# Patient Record
Sex: Male | Born: 1990 | Race: White | Hispanic: No | Marital: Single | Smoking: Never smoker
Health system: Southern US, Community
[De-identification: ages and names within clinical notes are randomized; demographics above are authoritative.]

## PROBLEM LIST (undated history)

## (undated) DIAGNOSIS — K219 Gastro-esophageal reflux disease without esophagitis: Secondary | ICD-10-CM

---
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
## 2012-08-21 ENCOUNTER — Emergency Department (HOSPITAL_COMMUNITY)
Admission: EM | Admit: 2012-08-21 | Discharge: 2012-08-21 | Disposition: A | Discharge status: Home / Self Care | Payer: Self-pay | Attending: Emergency Medicine | Admitting: Emergency Medicine

## 2012-08-21 ENCOUNTER — Emergency Department (HOSPITAL_COMMUNITY): Payer: Self-pay

## 2012-08-21 ENCOUNTER — Encounter (HOSPITAL_COMMUNITY): Payer: Self-pay | Admitting: *Deleted

## 2012-08-21 DIAGNOSIS — M25539 Pain in unspecified wrist: Secondary | ICD-10-CM | POA: Insufficient documentation

## 2012-08-21 DIAGNOSIS — K219 Gastro-esophageal reflux disease without esophagitis: Secondary | ICD-10-CM | POA: Insufficient documentation

## 2012-08-21 DIAGNOSIS — S61519A Laceration without foreign body of unspecified wrist, initial encounter: Secondary | ICD-10-CM

## 2012-08-21 DIAGNOSIS — S61509A Unspecified open wound of unspecified wrist, initial encounter: Secondary | ICD-10-CM | POA: Insufficient documentation

## 2012-08-21 DIAGNOSIS — W268XXA Contact with other sharp object(s), not elsewhere classified, initial encounter: Secondary | ICD-10-CM | POA: Insufficient documentation

## 2012-08-21 HISTORY — DX: Gastro-esophageal reflux disease without esophagitis: K21.9

## 2012-08-21 MED ORDER — TETANUS-DIPHTH-ACELL PERTUSSIS 5-2.5-18.5 LF-MCG/0.5 IM SUSP
0.5000 mL | Freq: Once | INTRAMUSCULAR | Status: AC
  Administered 2012-08-21: 0.5 mL via INTRAMUSCULAR
  Filled 2012-08-21: qty 0.5
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
## 2012-08-21 NOTE — ED Notes (Signed)
Patient transported to X-ray 

## 2012-08-21 NOTE — ED Provider Notes (Signed)
Medical screening examination/treatment/procedure(s) were performed by non-physician practitioner and as supervising physician I was immediately available for consultation/collaboration.   Gavin Pound. Annslee Tercero, MD 08/21/12 1402
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
## 2014-08-20 ENCOUNTER — Emergency Department (HOSPITAL_COMMUNITY): Payer: BC Managed Care – PPO

## 2014-08-20 ENCOUNTER — Emergency Department (HOSPITAL_COMMUNITY)
Admission: EM | Admit: 2014-08-20 | Discharge: 2014-08-20 | Disposition: A | Discharge status: Home / Self Care | Payer: BC Managed Care – PPO | Attending: Emergency Medicine | Admitting: Emergency Medicine

## 2014-08-20 ENCOUNTER — Encounter (HOSPITAL_COMMUNITY): Payer: Self-pay | Admitting: Emergency Medicine

## 2014-08-20 DIAGNOSIS — Z8719 Personal history of other diseases of the digestive system: Secondary | ICD-10-CM | POA: Diagnosis not present

## 2014-08-20 DIAGNOSIS — Y9389 Activity, other specified: Secondary | ICD-10-CM | POA: Diagnosis not present

## 2014-08-20 DIAGNOSIS — Y9289 Other specified places as the place of occurrence of the external cause: Secondary | ICD-10-CM | POA: Diagnosis not present

## 2014-08-20 DIAGNOSIS — S0101XA Laceration without foreign body of scalp, initial encounter: Secondary | ICD-10-CM | POA: Diagnosis not present

## 2014-08-20 DIAGNOSIS — S0990XA Unspecified injury of head, initial encounter: Secondary | ICD-10-CM | POA: Diagnosis present

## 2014-08-20 NOTE — Discharge Instructions (Signed)
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
## 2014-08-20 NOTE — ED Provider Notes (Signed)
I saw and evaluated the patient, reviewed the resident's note and I agree with the findings and plan.   .Face to face Exam:  General:  Awake HEENT:  Scalp laceration Resp:  Normal effort Abd:  Nondistended Neuro:No focal weakness    Nelia Shiobert L Jarrel Knoke, MD 08/20/14 1816
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
## 2019-06-08 ENCOUNTER — Other Ambulatory Visit: Payer: Self-pay

## 2019-06-08 ENCOUNTER — Ambulatory Visit (HOSPITAL_COMMUNITY)
Admission: EM | Admit: 2019-06-08 | Discharge: 2019-06-08 | Disposition: A | Discharge status: Home / Self Care | Payer: Self-pay | Attending: Family Medicine | Admitting: Family Medicine

## 2019-06-08 ENCOUNTER — Ambulatory Visit (INDEPENDENT_AMBULATORY_CARE_PROVIDER_SITE_OTHER): Payer: Self-pay

## 2019-06-08 ENCOUNTER — Encounter (HOSPITAL_COMMUNITY): Payer: Self-pay | Admitting: Emergency Medicine

## 2019-06-08 DIAGNOSIS — R531 Weakness: Secondary | ICD-10-CM | POA: Insufficient documentation

## 2019-06-08 DIAGNOSIS — Z79899 Other long term (current) drug therapy: Secondary | ICD-10-CM | POA: Insufficient documentation

## 2019-06-08 DIAGNOSIS — Z20828 Contact with and (suspected) exposure to other viral communicable diseases: Secondary | ICD-10-CM | POA: Insufficient documentation

## 2019-06-08 DIAGNOSIS — R05 Cough: Secondary | ICD-10-CM | POA: Insufficient documentation

## 2019-06-08 DIAGNOSIS — K219 Gastro-esophageal reflux disease without esophagitis: Secondary | ICD-10-CM | POA: Insufficient documentation

## 2019-06-08 DIAGNOSIS — R1110 Vomiting, unspecified: Secondary | ICD-10-CM | POA: Insufficient documentation

## 2019-06-08 DIAGNOSIS — R059 Cough, unspecified: Secondary | ICD-10-CM

## 2019-06-08 MED ORDER — ONDANSETRON 4 MG PO TBDP: 4.0000 mg | ORAL_TABLET | Freq: Three times a day (TID) | ORAL | 0 refills | Status: AC | PRN

## 2019-06-08 NOTE — Discharge Instructions (Addendum)
Your x-ray did not show any bowel obstruction and lungs were clear. Keep taking the omeprazole 20 mg daily. Avoid spicy, greasy foods and milk products. I will give you some Zofran to use as needed for nausea, vomiting We are sending your swab for COVID testing and we will call you if the results are positive Make sure you are taking precautions until we get your results. If your symptoms worsen you will need to go the ER.  Make sure you are staying hydrated and drinking plenty of water and Gatorade to avoid dehydration. Work note given to return on Friday if COVID results are negative.

## 2019-06-08 NOTE — ED Triage Notes (Signed)
Pt states since Friday hes felt fatigued, weak, states he has no appetite, has been vomiting. No pain

## 2019-06-08 NOTE — ED Provider Notes (Signed)
[facility]    CSN: [id number] Arrival date & time: 06/08/19  [id number]     History   Chief Complaint Chief Complaint  Patient presents with  . Weakness  . Emesis    HPI Timothy Blake is a 28 y.o. male.   Patient is an otherwise healthy 28 year old male that has been feeling fatigued, weak, loss of appetite and vomiting since 4 days ago.  Vomiting mostly after eating or drinking.  Symptoms have been waxing waning.  Reporting last bowel movement was Saturday.  No diarrhea.  He has also been coughing up mucus with black specks.  Admits to smoking marijuana.  Coughing with mucus production only in the morning.  No chest pain or shortness of breath.  Reporting fever of 102 yesterday.  Mom recently sick but tested negative for COVID.  Otherwise no recent sick contacts or recent traveling.   ROS per HPI      Past Medical History:  Diagnosis Date  . GERD (gastroesophageal reflux disease)     There are no active problems to display for this patient.   History reviewed. No pertinent surgical history.     Home Medications    Prior to Admission medications   Medication Sig Start Date End Date Taking? Authorizing Provider  omeprazole (PRILOSEC) 20 MG capsule Take 20 mg by mouth daily.    [provider]  ondansetron (ZOFRAN ODT) 4 MG disintegrating tablet Take 1 tablet (4 mg total) by mouth every 8 (eight) hours as needed for nausea or vomiting. 06/08/19   Janace ArisBast, Daxson Reffett A, NP    Family History No family history on file.  Social History Social History   Tobacco Use  . Smoking status: Not on file  Substance Use Topics  . Alcohol use: No  . Drug use: No     Allergies   Patient has no known allergies.   Review of Systems Review of Systems   Physical Exam Triage Vital Signs ED Triage Vitals  Enc Vitals Group     BP 06/08/19 1026 119/63     Pulse Rate 06/08/19 1026 91     Resp 06/08/19 1026 18     Temp 06/08/19 1027 98.1 F (36.7 C)   Temp src --      SpO2 06/08/19 1026 100 %     Weight --      Height --      Head Circumference --      Peak Flow --      Pain Score 06/08/19 1026 0     Pain Loc --      Pain Edu? --      Excl. in [facility]? --    No data found.  Updated Vital Signs BP 119/63   Pulse 91   Temp 98.1 F (36.7 C)   Resp 18   SpO2 100%   Visual Acuity Right Eye Distance:   Left Eye Distance:   Bilateral Distance:    Right Eye Near:   Left Eye Near:    Bilateral Near:     Physical Exam Vitals signs and nursing note reviewed.  Constitutional:      General: He is not in acute distress.    Appearance: Normal appearance. He is well-developed. He is not ill-appearing, toxic-appearing or diaphoretic.  HENT:     Head: Normocephalic and atraumatic.  Eyes:     Conjunctiva/sclera: Conjunctivae normal.  Neck:     Musculoskeletal: Neck supple.  Cardiovascular:     Rate and  Rhythm: Normal rate and regular rhythm.     Heart sounds: No murmur.  Pulmonary:     Effort: Pulmonary effort is normal. No respiratory distress.     Breath sounds: Normal breath sounds.     Comments: Course lung sounds in lower lobes.  Abdominal:     Palpations: Abdomen is soft.     Tenderness: There is no abdominal tenderness.  Musculoskeletal: Normal range of motion.  Skin:    General: Skin is warm and dry.  Neurological:     Mental Status: He is alert.  Psychiatric:        Mood and Affect: Mood normal.      UC Treatments / Results  Labs (all labs ordered are listed, but only abnormal results are displayed) Labs Reviewed  NOVEL CORONAVIRUS, NAA (HOSPITAL ORDER, SEND-OUT TO REF LAB)    EKG   Radiology Dg Abd Acute W/chest  Result Date: 06/08/2019 CLINICAL DATA:  Cough with nausea vomiting.  Abdominal pain EXAM: DG ABDOMEN ACUTE W/ 1V CHEST COMPARISON:  None. FINDINGS: There is no evidence of dilated bowel loops or free intraperitoneal air. No radiopaque calculi or other significant radiographic abnormality is  seen. Heart size and mediastinal contours are within normal limits. Both lungs are clear. IMPRESSION: Negative abdominal radiographs.  No acute cardiopulmonary disease. Electronically Signed   By: Franchot Gallo M.D.   On: 06/08/2019 11:28    Procedures Procedures (including critical care time)  Medications Ordered in UC Medications - No data to display  Initial Impression / Assessment and Plan / UC Course  I have reviewed the triage vital signs and the nursing notes.  Pertinent labs & imaging results that were available during my care of the patient were reviewed by me and considered in my medical decision making (see chart for details).     Patient is a 28 year old male with weakness, fatigue, loss of appetite and vomiting. Symptoms have been waxing waning. No acute abdomen on exam.  Vital signs are stable and he is nontoxic or ill-appearing.  X-ray of abdomen and chest revealed no acute abnormalities. No bowel obstruction, severity constipation or lower respiratory tract infection.  Sending COVID swab for testing. Instructed to rest, hydrate and monitor symptoms.  If symptoms severely worsen he will need to go the ER.  Otherwise if COVID results are negative he can return to work on Friday if feeling better Follow up as needed for continued or worsening symptoms  Final Clinical Impressions(s) / UC Diagnoses   Final diagnoses:  Vomiting, intractability of vomiting not specified, presence of nausea not specified, unspecified vomiting type  Cough     Discharge Instructions     Your x-ray did not show any bowel obstruction and lungs were clear. Keep taking the omeprazole 20 mg daily. Avoid spicy, greasy foods and milk products. I will give you some Zofran to use as needed for nausea, vomiting We are sending your swab for COVID testing and we will call you if the results are positive Make sure you are taking precautions until we get your results. If your symptoms worsen you  will need to go the ER.  Make sure you are staying hydrated and drinking plenty of water and Gatorade to avoid dehydration. Work note given to return on Friday if COVID results are negative.    ED Prescriptions    Medication Sig Dispense Auth. Provider   ondansetron (ZOFRAN ODT) 4 MG disintegrating tablet Take 1 tablet (4 mg total) by mouth every 8 (eight)  hours as needed for nausea or vomiting. 20 tablet Dahlia ByesBast, Aimi Essner A, NP     Controlled Substance Prescriptions [place] Controlled Substance Registry consulted? Not Applicable   Janace ArisBast, Hisham Provence A, NP 06/08/19 1141

## 2019-06-10 LAB — NOVEL CORONAVIRUS, NAA (HOSP ORDER, SEND-OUT TO REF LAB; TAT 18-24 HRS): SARS-CoV-2, NAA: NOT DETECTED

## 2021-08-24 ENCOUNTER — Emergency Department (HOSPITAL_COMMUNITY)
Admission: EM | Admit: 2021-08-24 | Discharge: 2021-08-25 | Disposition: A | Discharge status: Home / Self Care | Payer: Self-pay | Attending: Emergency Medicine | Admitting: Emergency Medicine

## 2021-08-24 ENCOUNTER — Other Ambulatory Visit: Payer: Self-pay

## 2021-08-24 ENCOUNTER — Emergency Department (HOSPITAL_COMMUNITY): Payer: Self-pay

## 2021-08-24 ENCOUNTER — Encounter (HOSPITAL_COMMUNITY): Payer: Self-pay | Admitting: *Deleted

## 2021-08-24 DIAGNOSIS — S6990XA Unspecified injury of unspecified wrist, hand and finger(s), initial encounter: Secondary | ICD-10-CM

## 2021-08-24 DIAGNOSIS — Z5321 Procedure and treatment not carried out due to patient leaving prior to being seen by health care provider: Secondary | ICD-10-CM | POA: Insufficient documentation

## 2021-08-24 DIAGNOSIS — S6991XA Unspecified injury of right wrist, hand and finger(s), initial encounter: Secondary | ICD-10-CM | POA: Insufficient documentation

## 2021-08-24 DIAGNOSIS — W228XXA Striking against or struck by other objects, initial encounter: Secondary | ICD-10-CM | POA: Insufficient documentation
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
## 2021-08-25 NOTE — ED Notes (Signed)
Pt decide to leave despite medical advice

## 2022-03-30 ENCOUNTER — Other Ambulatory Visit: Payer: Self-pay

## 2022-03-30 ENCOUNTER — Emergency Department (HOSPITAL_COMMUNITY): Payer: Self-pay

## 2022-03-30 ENCOUNTER — Encounter (HOSPITAL_COMMUNITY): Payer: Self-pay | Admitting: Emergency Medicine

## 2022-03-30 ENCOUNTER — Emergency Department (HOSPITAL_COMMUNITY)
Admission: EM | Admit: 2022-03-30 | Discharge: 2022-03-30 | Disposition: A | Discharge status: Home / Self Care | Payer: Self-pay | Attending: Emergency Medicine | Admitting: Emergency Medicine

## 2022-03-30 DIAGNOSIS — S51831A Puncture wound without foreign body of right forearm, initial encounter: Secondary | ICD-10-CM | POA: Insufficient documentation

## 2022-03-30 DIAGNOSIS — S21139A Puncture wound without foreign body of unspecified front wall of thorax without penetration into thoracic cavity, initial encounter: Secondary | ICD-10-CM | POA: Insufficient documentation

## 2022-03-30 DIAGNOSIS — Z23 Encounter for immunization: Secondary | ICD-10-CM | POA: Insufficient documentation

## 2022-03-30 DIAGNOSIS — W540XXA Bitten by dog, initial encounter: Secondary | ICD-10-CM | POA: Insufficient documentation

## 2022-03-30 DIAGNOSIS — Z203 Contact with and (suspected) exposure to rabies: Secondary | ICD-10-CM | POA: Insufficient documentation

## 2022-03-30 MED ORDER — RABIES IMMUNE GLOBULIN 150 UNIT/ML IM INJ: 20.0000 [IU]/kg | INJECTION | Freq: Once | INTRAMUSCULAR | Status: DC

## 2022-03-30 MED ORDER — AMOXICILLIN-POT CLAVULANATE 875-125 MG PO TABS: 1.0000 | ORAL_TABLET | Freq: Two times a day (BID) | ORAL | 0 refills | Status: AC

## 2022-03-30 MED ORDER — RABIES IMMUNE GLOBULIN 150 UNIT/ML IM INJ: 150.0000 [IU] | INJECTION | Freq: Once | INTRAMUSCULAR | Status: DC

## 2022-03-30 MED ORDER — RABIES VACCINE, PCEC IM SUSR
1.0000 mL | Freq: Once | INTRAMUSCULAR | Status: AC
  Administered 2022-03-30: 1 mL via INTRAMUSCULAR
  Filled 2022-03-30: qty 1

## 2022-03-30 MED ORDER — TETANUS-DIPHTH-ACELL PERTUSSIS 5-2.5-18.5 LF-MCG/0.5 IM SUSY
0.5000 mL | PREFILLED_SYRINGE | Freq: Once | INTRAMUSCULAR | Status: AC
  Administered 2022-03-30: 0.5 mL via INTRAMUSCULAR
  Filled 2022-03-30: qty 0.5

## 2022-03-30 MED ORDER — AMOXICILLIN-POT CLAVULANATE 875-125 MG PO TABS
1.0000 | ORAL_TABLET | Freq: Once | ORAL | Status: AC
  Administered 2022-03-30: 1 via ORAL
  Filled 2022-03-30: qty 1

## 2022-03-30 MED ORDER — RABIES IMMUNE GLOBULIN 150 UNIT/ML IM INJ: 1500.0000 [IU] | INJECTION | Freq: Once | INTRAMUSCULAR | Status: DC

## 2022-03-30 NOTE — ED Provider Notes (Signed)
[facility]   CSN: [id number] Arrival date & time: 03/30/22  1346     History  Chief Complaint  Patient presents with   Animal Bite    Timothy Blake is a 31 y.o. male who presents to the ED today with multiple dog bites to R arm and chest that occurred around 1 PM today. Pt reports that he was outside when a neighbor's german shepherd got loose, hopped the fence, and attacked him. Bleeding controlled at this time. He reports Animal Control took the dog however is unsure what the plan is - he does not believe the dog is UTD on rabies vaccines. Pt is not UTD on tetanus either. He has no other complaints at this time.   The history is provided by the patient, medical records and the spouse.      Home Medications Prior to Admission medications   Medication Sig Start Date End Date Taking? Authorizing Provider  amoxicillin-clavulanate (AUGMENTIN) 875-125 MG tablet Take 1 tablet by mouth every 12 (twelve) hours. 03/30/22  Yes Lashon Beringer, PA-C  omeprazole (PRILOSEC) 20 MG capsule Take 20 mg by mouth daily.    [provider]  ondansetron (ZOFRAN ODT) 4 MG disintegrating tablet Take 1 tablet (4 mg total) by mouth every 8 (eight) hours as needed for nausea or vomiting. 06/08/19   Janace ArisBast, Traci A, NP      Allergies    Patient has no known allergies.    Review of Systems   Review of Systems  Constitutional:  Negative for chills and fever.  Respiratory:  Negative for shortness of breath.   Cardiovascular:  Negative for chest pain.  Musculoskeletal:  Positive for arthralgias.  Skin:  Positive for wound.  All other systems reviewed and are negative.  Physical Exam Updated Vital Signs BP 123/72 (BP Location: Right Arm)   Pulse 74   Temp 97.8 F (36.6 C)   Resp 18   Wt 83.9 kg   SpO2 98%   BMI 28.98 kg/m  Physical Exam Vitals and nursing note reviewed.  Constitutional:      Appearance: He is not ill-appearing.   HENT:     Head: Normocephalic and atraumatic.  Eyes:     Conjunctiva/sclera: Conjunctivae normal.  Cardiovascular:     Rate and Rhythm: Normal rate and regular rhythm.  Pulmonary:     Effort: Pulmonary effort is normal.     Breath sounds: Normal breath sounds. No wheezing, rhonchi or rales.     Comments: See photo below. 1 cm puncture wound/laceration to chest wall; bleeding controlled.  LCTAB. Speaking in full sentences. Satting 98% on RA.  Musculoskeletal:     Comments: See photos below. Multiple puncture wounds to R forearm/hand. Largest measuring approximately 2 cm with adipose tissue exposed. 2+ radial pulse. ROM intact to entire RUE. Strength and sensation intact. Cap refill < 2 seconds to all digits.   Skin:    General: Skin is warm and dry.     Coloration: Skin is not jaundiced.  Neurological:     Mental Status: He is alert.             ED Results / Procedures / Treatments   Labs (all labs ordered are listed, but only abnormal results are displayed) Labs Reviewed - No data to display  EKG None  Radiology DG Chest 2 View  Result Date: 03/30/2022 CLINICAL DATA:  Dog bite.  Rule out foreign body. EXAM: CHEST - 2 VIEW  COMPARISON:  June 08, 2019 FINDINGS: The heart size and mediastinal contours are within normal limits. Both lungs are clear. The visualized skeletal structures are unremarkable. IMPRESSION: No active cardiopulmonary disease. Electronically Signed   By: Gerome Sam III M.D.   On: 03/30/2022 16:27   DG Forearm Right  Result Date: 03/30/2022 CLINICAL DATA:  Dog bite.  Rule out foreign body. EXAM: RIGHT FOREARM - 2 VIEW COMPARISON:  None Available. FINDINGS: Air is seen in the soft tissues along the ventral aspect of the distal forearm. A small amount of air is also seen in the distal forearm dorsally. No foreign bodies identified in these locations. No other sites of foreign body identified in the forearm. No fractures are noted. IMPRESSION:  Lacerations as above.  No foreign body identified. Electronically Signed   By: Gerome Sam III M.D.   On: 03/30/2022 16:26   DG Hand Complete Right  Result Date: 03/30/2022 CLINICAL DATA:  Dog bite EXAM: RIGHT HAND - COMPLETE 3+ VIEW COMPARISON:  None Available. FINDINGS: There is a small foreign body in the soft tissues of the distal thumb. No fractures. No other foreign bodies. IMPRESSION: Small soft tissue foreign body in the distal thumb. No other abnormalities. Electronically Signed   By: Gerome Sam III M.D.   On: 03/30/2022 16:25    Procedures Procedures    Medications Ordered in ED Medications  rabies vaccine (RABAVERT) injection 1 mL (1 mL Intramuscular Given 03/30/22 1722)  Tdap (BOOSTRIX) injection 0.5 mL (0.5 mLs Intramuscular Given 03/30/22 1726)  amoxicillin-clavulanate (AUGMENTIN) 875-125 MG per tablet 1 tablet (1 tablet Oral Given 03/30/22 1722)    ED Course/ Medical Decision Making/ A&P                           Medical Decision Making 31 year old male who presents to the ED today after sustaining multiple dog bites to the chest and right forearm earlier today.  Unknown if dog is up-to-date on rabies however states that animal control has possession of dog at this time.  Patient is not up-to-date on tetanus.  On arrival vitals are stable.  Patient's bleeding has been controlled.  He is noted to have a 1 cm puncture wound/laceration to his chest wall on the right side.  He has multiple puncture wounds to the right forearm with largest measuring about 2 cm.  He is neurovascularly intact.  We will plan for x-rays to assess for any foreign bodies or bony abnormality.  Given bite on chest we will also plan for chest x-ray.  He is however able speak in full sentences and satting 98% on room air.  Lungs clear to auscultation bilaterally.  No concern for pneumo thorax at this time.  We will plan to update tetanus shot at this time.  Plan to irrigate wound extensively. Do not want  to place sutures at this time to prevent infection.  Dose of Augmentin provided in the ED today and will plan to discharge home with same.  I did have lengthy discussion with patient regarding rabies vaccine.  I did offer for him to call animal control to discuss options however patient states that he would rather do rabies injections today to be on the safe side.   Pharmacist was able to contact Animal Control - they have the animal in possession and are monitoring it for 10 days. He was able to have a lengthy discussion with patient regarding rabies vaccine. Pt is hesitant  to leave it to just monitoring the dog and would feel more comfortable receiving the rabies series without the imuno-globulin.   Thumb xray as below - incidental finding suspected.   Wounds have been irrigated by nursing staff/tach. Dressings applied. Pt discharged home at this time with Rx Augmentin and additional rabies vaccine schedule. He has been instructed on strict return precautions including redness/swelling around the wound, drainage of pus, fevers > 100.4, red streaking up arm, or any other new/concerning symptoms. Pt is in agreement with plan and stable for discharge home.   Problems Addressed: Dog bite, initial encounter: acute illness or injury  Amount and/or Complexity of Data Reviewed Radiology: ordered and independent interpretation performed. Decision-making details documented in ED Course.    Details: CXR and forearm xray negative. Xray of R hand with small soft tissue FB in distal thumb - on exam pt without any puncture wounds to this area. Suspect incidental finding/previous injury/questionable splinter?  Risk Prescription drug management.          Final Clinical Impression(s) / ED Diagnoses Final diagnoses:  Dog bite, initial encounter    Rx / DC Orders ED Discharge Orders          Ordered    amoxicillin-clavulanate (AUGMENTIN) 875-125 MG tablet  Every 12 hours        03/30/22 1756              Discharge Instructions      Please pick up antibiotics and take as prescribed. You can take Ibuprofen/Tylenol as needed for pain and apply ice as needed.   Keep wounds clean and dry. You can apply OTC bacitracin ointment to help with wound healing.   Attached is a schedule for additional rabies vaccines. You can go to any urgent care to have this done however it is recommended that you call ahead to make sure they have the vaccine.   Return to the ED for any new/worsening symptoms including redness/swelling around the wound, drainage of pus, red streaking up your arm, fevers > 100.4, chills, or any other new/concerning symptoms.        Tanda Rockers, PA-C 03/30/22 1759    Terald Sleeper, MD 03/30/22 2113

## 2022-03-30 NOTE — ED Triage Notes (Signed)
Dog bites to R hand, R forearm, and small wound to R chest from his sister's neighbor's dog.  Unknown if dog is vaccinated.  Unknown DT.  Bleeding controlled.

## 2022-03-30 NOTE — Discharge Instructions (Addendum)
Please pick up antibiotics and take as prescribed. You can take Ibuprofen/Tylenol as needed for pain and apply ice as needed.   Keep wounds clean and dry. You can apply OTC bacitracin ointment to help with wound healing.   Attached is a schedule for additional rabies vaccines. You can go to any urgent care to have this done however it is recommended that you call ahead to make sure they have the vaccine.   Return to the ED for any new/worsening symptoms including redness/swelling around the wound, drainage of pus, red streaking up your arm, fevers > 100.4, chills, or any other new/concerning symptoms.

## 2022-04-02 ENCOUNTER — Ambulatory Visit (HOSPITAL_COMMUNITY)
Admission: RE | Admit: 2022-04-02 | Discharge: 2022-04-02 | Disposition: A | Discharge status: Home / Self Care | Payer: Self-pay | Source: Ambulatory Visit | Attending: Internal Medicine | Admitting: Internal Medicine

## 2022-04-02 DIAGNOSIS — Z203 Contact with and (suspected) exposure to rabies: Secondary | ICD-10-CM

## 2022-04-02 MED ORDER — RABIES VACCINE, PCEC IM SUSR
INTRAMUSCULAR | Status: AC
  Filled 2022-04-02: qty 1

## 2022-04-02 MED ORDER — RABIES VACCINE, PCEC IM SUSR
1.0000 mL | Freq: Once | INTRAMUSCULAR | Status: AC
  Administered 2022-04-02: 1 mL via INTRAMUSCULAR

## 2022-04-02 NOTE — ED Triage Notes (Signed)
Pt presents for second rabies follow up injection.

## 2022-04-06 ENCOUNTER — Ambulatory Visit (HOSPITAL_COMMUNITY)
Admission: RE | Admit: 2022-04-06 | Discharge: 2022-04-06 | Disposition: A | Discharge status: Home / Self Care | Payer: Self-pay | Source: Ambulatory Visit | Attending: Internal Medicine | Admitting: Internal Medicine

## 2022-04-06 ENCOUNTER — Encounter (HOSPITAL_COMMUNITY): Payer: Self-pay

## 2022-04-06 DIAGNOSIS — Z203 Contact with and (suspected) exposure to rabies: Secondary | ICD-10-CM

## 2022-04-06 MED ORDER — RABIES VACCINE, PCEC IM SUSR
INTRAMUSCULAR | Status: AC
  Filled 2022-04-06: qty 1

## 2022-04-06 MED ORDER — RABIES VACCINE, PCEC IM SUSR
1.0000 mL | Freq: Once | INTRAMUSCULAR | Status: AC
  Administered 2022-04-06: 1 mL via INTRAMUSCULAR

## 2022-04-06 NOTE — ED Triage Notes (Signed)
Pt presents for 3rd rabies injection. 

## 2022-04-13 ENCOUNTER — Ambulatory Visit (HOSPITAL_COMMUNITY)
Admission: RE | Admit: 2022-04-13 | Discharge: 2022-04-13 | Disposition: A | Discharge status: Home / Self Care | Payer: Self-pay | Source: Ambulatory Visit | Attending: Internal Medicine | Admitting: Internal Medicine

## 2022-04-13 ENCOUNTER — Encounter (HOSPITAL_COMMUNITY): Payer: Self-pay

## 2022-04-13 DIAGNOSIS — Z203 Contact with and (suspected) exposure to rabies: Secondary | ICD-10-CM

## 2022-04-13 MED ORDER — RABIES VACCINE, PCEC IM SUSR
INTRAMUSCULAR | Status: AC
  Filled 2022-04-13: qty 1

## 2022-04-13 MED ORDER — RABIES VACCINE, PCEC IM SUSR
1.0000 mL | Freq: Once | INTRAMUSCULAR | Status: AC
  Administered 2022-04-13: 1 mL via INTRAMUSCULAR

## 2022-04-13 NOTE — ED Triage Notes (Signed)
Here for day 14, final injection of rabies series.

## 2022-04-13 NOTE — ED Notes (Signed)
Verified with Lorenda Ishihara and Hagler MD patient does not need fifth rabies vaccination due to lack of symptoms, no indication dog was positive for rabies, patient denying any medical history of immune compromise.

## 2023-01-30 IMAGING — CR DG HAND COMPLETE 3+V*R*
3 series · 3 of 3 positions shown · non-contrast
Comparison: None Available.

CLINICAL DATA: Dog bite

EXAM:
RIGHT HAND - COMPLETE 3+ VIEW

[hand pa]
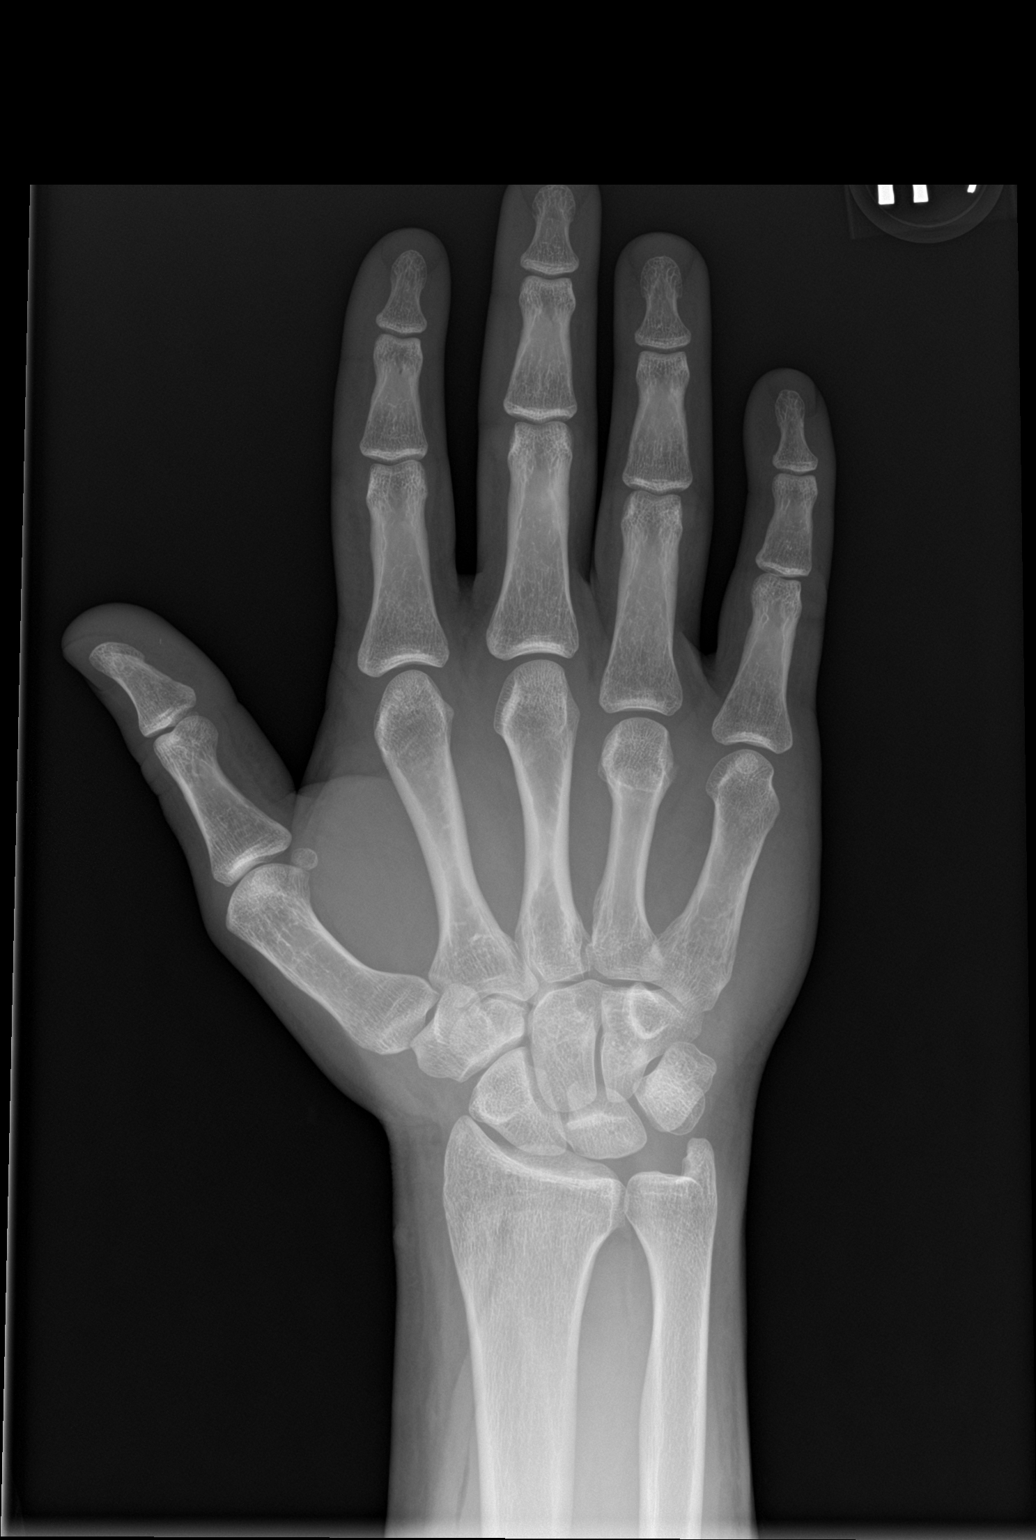

[hand obl]
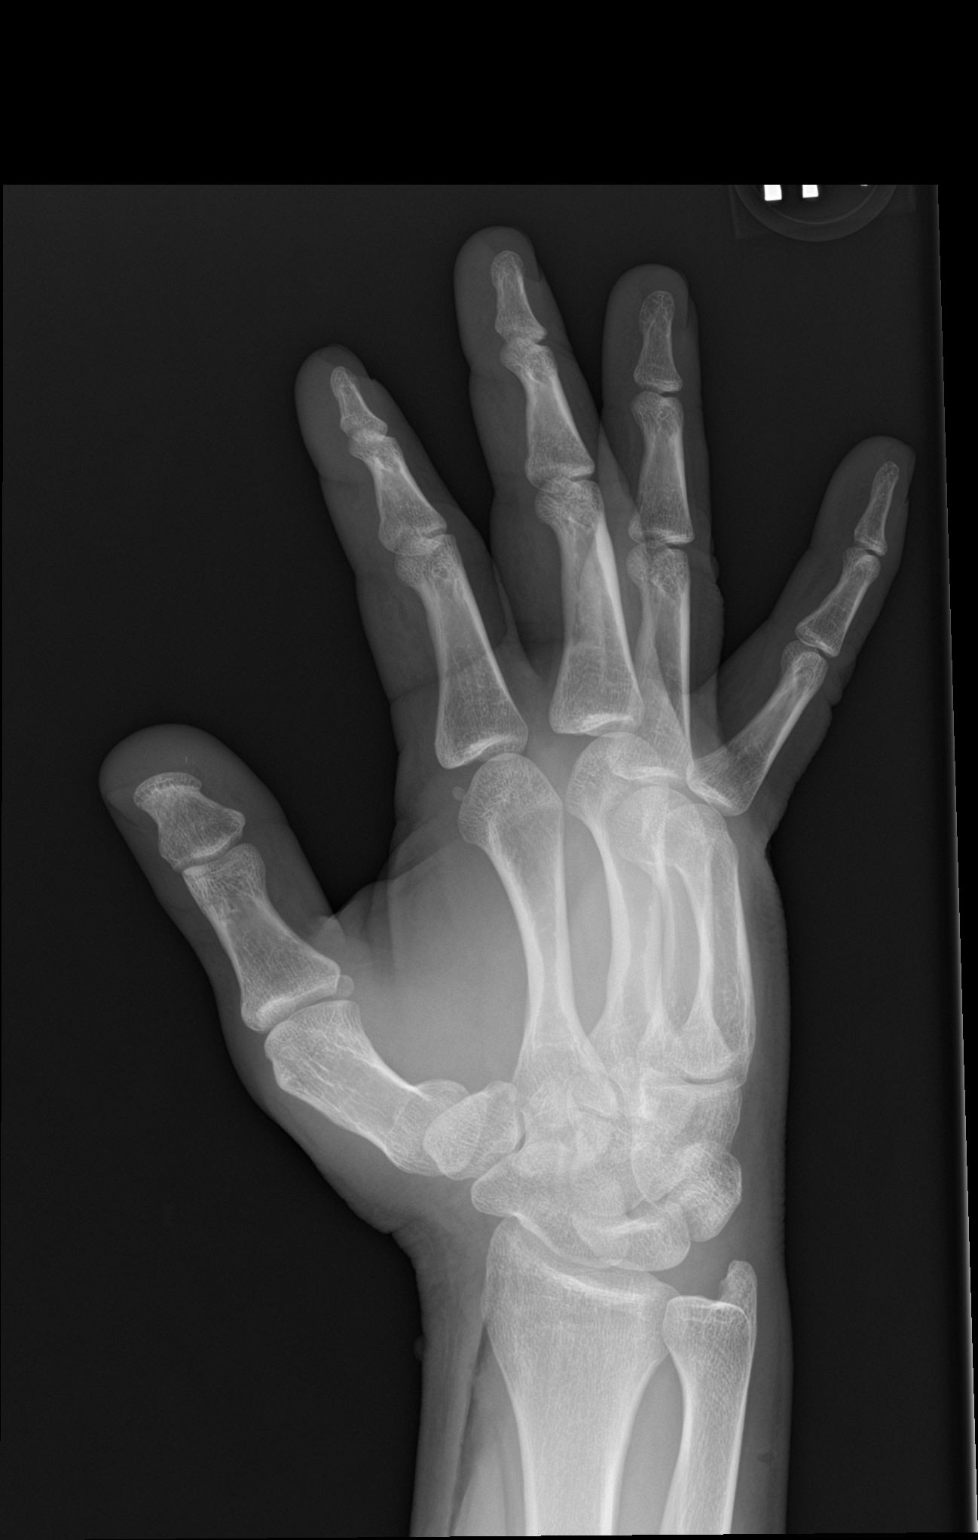

[hand lat]
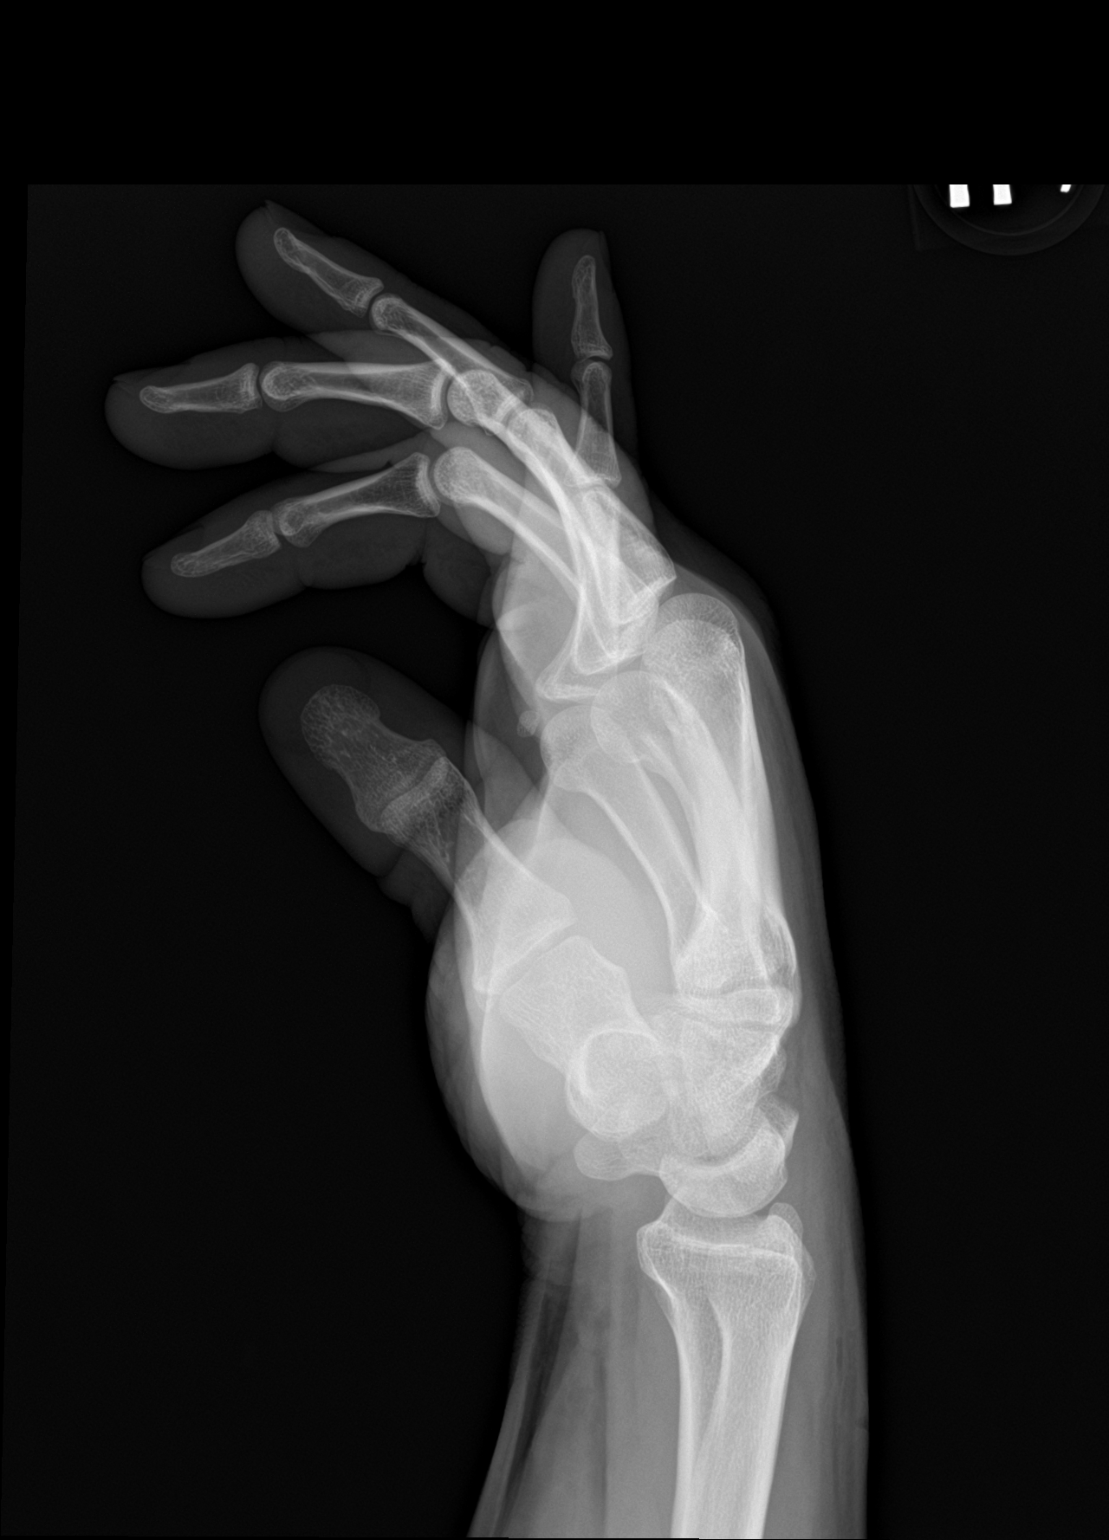

[3 of 3 positions shown; findings below may reference images not displayed]

FINDINGS: There is a small foreign body in the soft tissues of the distal
thumb. No fractures. No other foreign bodies.
IMPRESSION: Small soft tissue foreign body in the distal thumb. No other
abnormalities.

## 2023-01-30 IMAGING — CR DG CHEST 2V
2 series · 2 of 2 positions shown · non-contrast
Comparison: June 08, 2019

CLINICAL DATA: Dog bite.  Rule out foreign body.

EXAM:
CHEST - 2 VIEW

[chest lat]
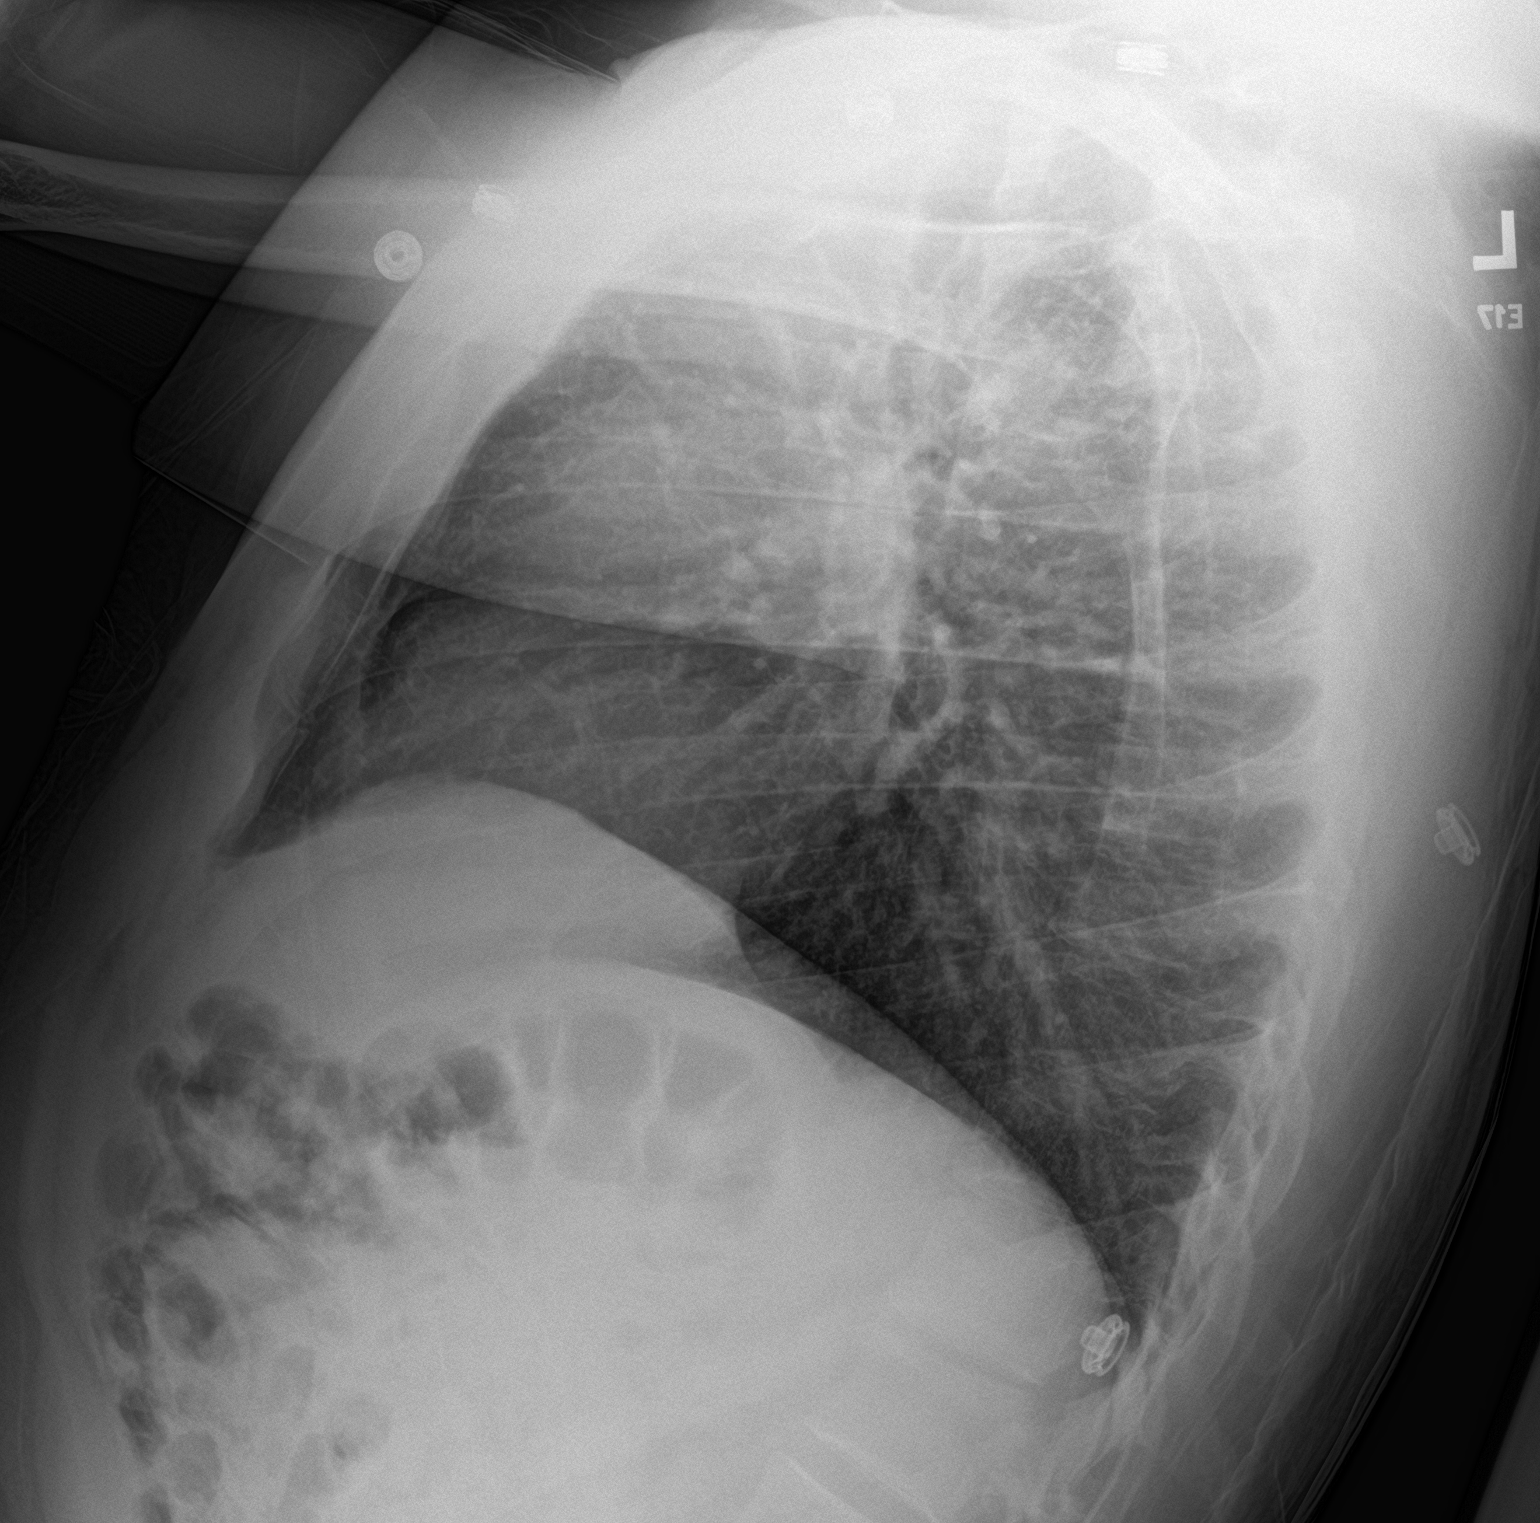

[chest ap]
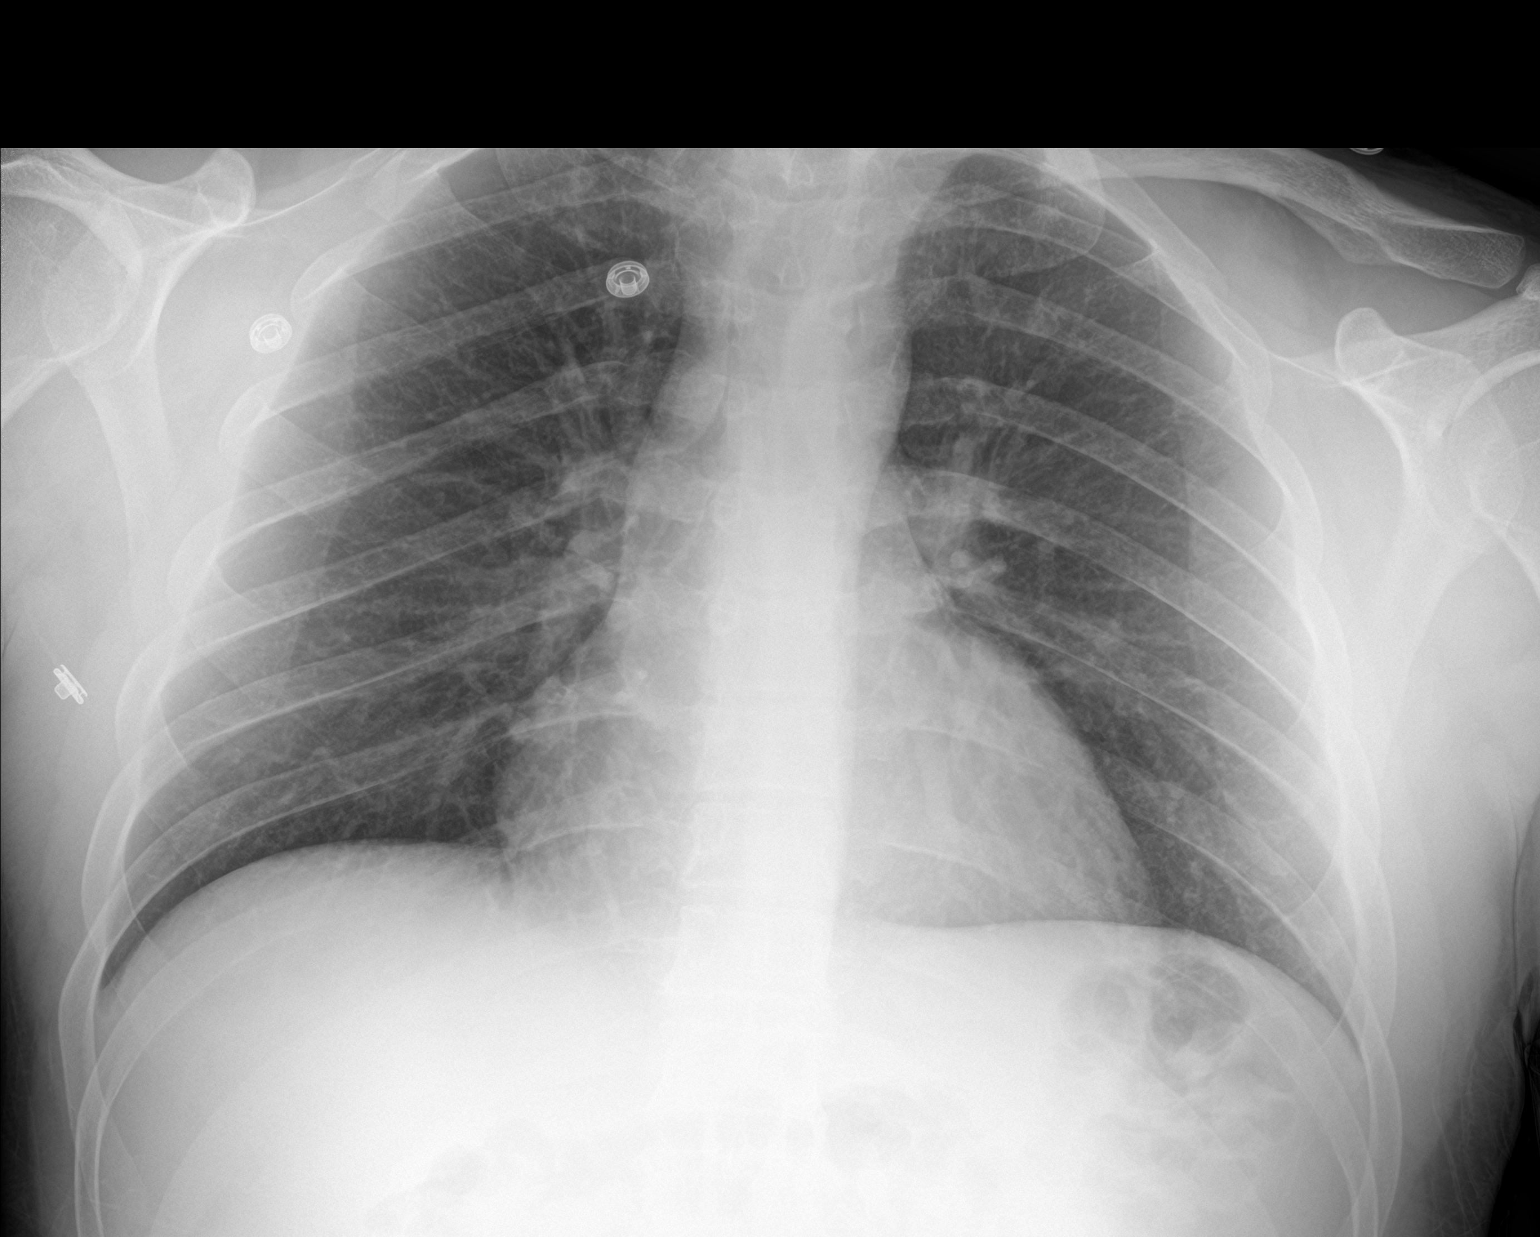

[2 of 2 positions shown; findings below may reference images not displayed]

FINDINGS: The heart size and mediastinal contours are within normal limits.
Both lungs are clear. The visualized skeletal structures are
unremarkable.
IMPRESSION: No active cardiopulmonary disease.
